# Patient Record
Sex: Female | Born: 1990 | Race: Black or African American | Hispanic: No | Marital: Single | Smoking: Never smoker
Health system: Southern US, Community
[De-identification: ages and names within clinical notes are randomized; demographics above are authoritative.]

---

## 2013-07-31 ENCOUNTER — Emergency Department (HOSPITAL_COMMUNITY): Payer: Self-pay

## 2013-07-31 ENCOUNTER — Encounter (HOSPITAL_COMMUNITY): Payer: Self-pay | Admitting: Emergency Medicine

## 2013-07-31 ENCOUNTER — Emergency Department (HOSPITAL_COMMUNITY)
Admission: EM | Admit: 2013-07-31 | Discharge: 2013-07-31 | Disposition: A | Discharge status: Home / Self Care | Payer: Self-pay | Attending: Emergency Medicine | Admitting: Emergency Medicine

## 2013-07-31 DIAGNOSIS — R112 Nausea with vomiting, unspecified: Secondary | ICD-10-CM | POA: Insufficient documentation

## 2013-07-31 DIAGNOSIS — R51 Headache: Secondary | ICD-10-CM | POA: Insufficient documentation

## 2013-07-31 DIAGNOSIS — R109 Unspecified abdominal pain: Secondary | ICD-10-CM | POA: Insufficient documentation

## 2013-07-31 DIAGNOSIS — R05 Cough: Secondary | ICD-10-CM

## 2013-07-31 DIAGNOSIS — R059 Cough, unspecified: Secondary | ICD-10-CM

## 2013-07-31 DIAGNOSIS — Z8709 Personal history of other diseases of the respiratory system: Secondary | ICD-10-CM | POA: Insufficient documentation

## 2013-07-31 DIAGNOSIS — Z3202 Encounter for pregnancy test, result negative: Secondary | ICD-10-CM | POA: Insufficient documentation

## 2013-07-31 DIAGNOSIS — R0602 Shortness of breath: Secondary | ICD-10-CM | POA: Insufficient documentation

## 2013-07-31 LAB — POC URINE PREG, ED: Preg Test, Ur: NEGATIVE

## 2013-07-31 MED ORDER — ALBUTEROL SULFATE HFA 108 (90 BASE) MCG/ACT IN AERS
2.0000 | INHALATION_SPRAY | RESPIRATORY_TRACT | Status: DC
  Filled 2013-07-31: qty 6.7

## 2013-07-31 MED ORDER — PREDNISONE 20 MG PO TABS
40.0000 mg | ORAL_TABLET | Freq: Once | ORAL | Status: AC
  Administered 2013-07-31: 40 mg via ORAL
  Filled 2013-07-31: qty 2

## 2013-07-31 MED ORDER — PREDNISONE 20 MG PO TABS
ORAL_TABLET | ORAL | Status: AC
Start: 2013-07-31 — End: ?

## 2013-07-31 MED ORDER — IPRATROPIUM-ALBUTEROL 0.5-2.5 (3) MG/3ML IN SOLN
3.0000 mL | Freq: Once | RESPIRATORY_TRACT | Status: AC
  Administered 2013-07-31: 3 mL via RESPIRATORY_TRACT
  Filled 2013-07-31: qty 3

## 2013-07-31 NOTE — ED Notes (Signed)
Pt discharged.Vital signs stable and GCS 15 

## 2013-07-31 NOTE — ED Provider Notes (Signed)
CSN: [id number]     Arrival date & time 07/31/13  2016 History   First MD Initiated Contact with Patient 07/31/13 2128     Chief Complaint  Patient presents with  . Cough  . Headache     (Consider location/radiation/quality/duration/timing/severity/associated sxs/prior Treatment) HPI 23 yo female presents to ED with cough x 2 weeks. Patient states 2-3 weeks ago she got the GI bug and afterwards developed a "cold" with Sore throat, HAs, Cough, congestion. Patient states all other symptom improved but cough has been persistent. Patient describes dry cough that is better during the day and worse in the evenings. Patient denies fever/chills, HA, and Chest pain. Admits to SOB associated with cough, nausea, and a couple episodes of posttussive vomiting. Patient admits to a generalized belly pain currently but thinks it might be because she hasnt had anything to eat today.   Age > 21 yo: No HR > 100 bpm: No O2 sat on RA < 95%: No Prior hx of venous thromboembolism:No Trauma or surgery in past 4 wks:No Hemoptysis:No Exogenous Estrogen use:No Unilateral Leg swelling: No Pre tests probability for PE < 15%:No      History reviewed. No pertinent past medical history. History reviewed. No pertinent past surgical history. No family history on file. History  Substance Use Topics  . Smoking status: Never Smoker   . Smokeless tobacco: Not on file  . Alcohol Use: Yes   OB History   Grav Para Term Preterm Abortions TAB SAB Ect Mult Living                 Review of Systems  All other systems reviewed and are negative.      Allergies  Review of patient's allergies indicates no known allergies.  Home Medications   Current Outpatient Rx  Name  Route  Sig  Dispense  Refill  . levonorgestrel (MIRENA) 20 MCG/24HR IUD   Intrauterine   1 each by Intrauterine route once.         Marland Kitchen. PE-DM-APAP & Doxylamin-DM-APAP (VICKS DAYQUIL/NYQUIL CLD & FLU) (LIQUID) MISC   Oral   Take 2 capsules  by mouth 2 (two) times daily as needed (for cold).         . Phenylephrine-Pheniramine-DM (THERAFLU COLD & COUGH) 03-16-19 MG PACK   Oral   Take 1 Package by mouth daily as needed (for cold).         . Throat Lozenges (COUGH DROPS MT)   Mouth/Throat   Use as directed 1 lozenge in the mouth or throat every 4 (four) hours as needed (for sore throat).         . predniSONE (DELTASONE) 20 MG tablet      2 tabs po daily x 4 days   8 tablet   0    BP 102/60  Pulse 98  Temp(Src) 99.5 F (37.5 C) (Oral)  Resp 18  Ht 5\' 4"  (1.626 m)  Wt 124 lb 8 oz (56.473 kg)  BMI 21.36 kg/m2  SpO2 99%  LMP 06/28/2013 Physical Exam  Nursing note and vitals reviewed. Constitutional: She is oriented to person, place, and time. She appears well-developed and well-nourished. No distress.  HENT:  Head: Normocephalic and atraumatic.  Right Ear: Tympanic membrane and ear canal normal.  Left Ear: Tympanic membrane and ear canal normal.  Nose: Nose normal. Right sinus exhibits no maxillary sinus tenderness and no frontal sinus tenderness. Left sinus exhibits no maxillary sinus tenderness and no frontal sinus tenderness.  Mouth/Throat: Uvula  is midline, oropharynx is clear and moist and mucous membranes are normal. No oropharyngeal exudate, posterior oropharyngeal edema or posterior oropharyngeal erythema.  Eyes: Conjunctivae are normal. Pupils are equal, round, and reactive to light. Right eye exhibits no discharge. Left eye exhibits no discharge. No scleral icterus.  Neck: Normal range of motion and phonation normal. Neck supple. No JVD present. No rigidity. No tracheal deviation, no edema and no erythema present.  Cardiovascular: Normal rate and regular rhythm.  Exam reveals no gallop and no friction rub.   No murmur heard. Pulmonary/Chest: Effort normal. No stridor. No respiratory distress. She has decreased breath sounds in the right lower field. She has no wheezes. She has no rhonchi. She has no  rales.  Abdominal: Soft. Bowel sounds are normal. There is no tenderness.  Musculoskeletal: Normal range of motion. She exhibits no edema.  Lymphadenopathy:    She has no cervical adenopathy.  Neurological: She is alert and oriented to person, place, and time.  Skin: Skin is warm and dry. She is not diaphoretic.  Psychiatric: She has a normal mood and affect. Her behavior is normal.    ED Course  Procedures (including critical care time) Labs Review Labs Reviewed  POC URINE PREG, ED   Imaging Review Dg Chest 2 View (if Patient Has Fever And/or Copd)  07/31/2013   CLINICAL DATA:  Cough and congestion from previous 3 weeks  EXAM: CHEST  2 VIEW  COMPARISON:  None.  FINDINGS: The lungs are adequately inflated. There is no focal infiltrate. The lung markings are coarse in the retrocardiac region likely on the right. The cardiopericardial silhouette is normal in size. The pulmonary vascularity is not engorged. There is no pleural effusion or pneumothorax. The mediastinum is normal in width. The observed portions of the bony thorax appear normal. Nipple rings are present bilaterally.  IMPRESSION: There is no alveolar pneumonia. Subsegmental atelectasis in the right lower lobe may be present and may be associated with acute bronchitis. There is no evidence of CHF.   Electronically Signed   By: David  [place]   On: 07/31/2013 20:32     EKG Interpretation None      MDM   Final diagnoses:  Cough  Patient afebrile with normal vital signs.  Patient PERC negative.  Urine preg negative CXR shows subsegmental atelectasis in RT lower lobe with possible bronchitis. No pneumonia or CHF.   Patient given breathing tx in ED. Patient reports breathing improved. Plan to start patient on Prednisone burst for suspected bronchitis. Patient given MDI albuterol. Discussed findings with patient. Advised follow [facility] with [facility] Wellness. Recommend return to ED should symptoms worsen. Patient  agrees with plan. Discharged in good condition.   Meds given in ED:  Medications  albuterol (PROVENTIL HFA;VENTOLIN HFA) 108 (90 BASE) MCG/ACT inhaler 2 puff (not administered)  ipratropium-albuterol (DUONEB) 0.5-2.5 (3) MG/3ML nebulizer solution 3 mL (3 mLs Nebulization Given 07/31/13 2152)  predniSONE (DELTASONE) tablet 40 mg (40 mg Oral Given 07/31/13 2219)    Discharge Medication List as of 07/31/2013 10:10 PM    START taking these medications   Details  predniSONE (DELTASONE) 20 MG tablet 2 tabs po daily x 4 days, Print          Rudene Anda, PA-C 08/01/13 [phone]

## 2013-07-31 NOTE — Discharge Instructions (Signed)
Follow [facility] with [facility]. Take medications as directed. Return to Emergency department if you develop any chest pain, shortness of breath, fever/chills, or coughing up blood. Resource guide provided below for further follow up.   Emergency Department Resource Guide 1) Find a Doctor and Pay Out of Pocket Although you won't have to find out who is covered by your insurance plan, it is a good idea to ask around and get recommendations. You will then need to call the office and see if the doctor you have chosen will accept you as a new patient and what types of options they offer for patients who are self-pay. Some doctors offer discounts or will set up payment plans for their patients who do not have insurance, but you will need to ask so you aren't surprised when you get to your appointment.  2) Contact Your Local Health Department Not all health departments have doctors that can see patients for sick visits, but many do, so it is worth a call to see if yours does. If you don't know where your local health department is, you can check in your phone book. The CDC also has a tool to help you locate your state's health department, and many state websites also have listings of all of their local health departments.  3) Find a Walk-in Clinic If your illness is not likely to be very severe or complicated, you may want to try a walk in clinic. These are popping up all over the country in pharmacies, drugstores, and shopping centers. They're usually staffed by nurse practitioners or physician assistants that have been trained to treat common illnesses and complaints. They're usually fairly quick and inexpensive. However, if you have serious medical issues or chronic medical problems, these are probably not your best option.  No Primary Care Doctor: - Call Health Connect at  [phone] - they can help you locate a primary care doctor that  accepts your insurance, provides certain services,  etc. - Physician Referral Service- [phone]  Chronic Pain Problems: Organization         Address  Phone   Notes  [facility]  [phone] Patients need to be referred by their primary care doctor.   Medication Assistance: Organization         Address  Phone   Notes  [facility] [address] [place], [place] [address] [phone] --Must be a resident of [facility] -- Must have NO insurance coverage whatsoever (no Medicaid/ Medicare, etc.) -- The pt. MUST have a primary care doctor that directs their care regularly and follows them in the community   MedAssist  [phone]   [employer]  [phone]    Agencies that provide inexpensive medical care: Organization         Address  Phone   Notes  [facility]  [phone]   [facility]    [phone]   [facility] [address] [place], [place] [address] [phone]   [facility] [address], [place] [phone]   [facility]    [phone](336) [phone]   [facility]    [phone]   [facility]  [address], [place] Phone:  [phone]) [phone], Fax:  [phone] Hours of Operation:  9 am - 6 pm, M-F.  Also accepts Medicaid/Medicare and self-pay.  [facility] [address], [address], [place] Phone: [phone], Fax: [phone]. Hours of Operation:  8:30 am - 5:30 pm, M-F.  Also accepts Medicaid and self-pay.  [facility] [address] [place] Phone: [phone]   [place], [place], [place] [phone], Ext. [phone] Mondays & Thursdays: 7-9 AM.  First 15 patients are seen on a first come, first serve basis.    [place] Providers:  Organization         Address  Phone   Notes  [facility] [address], [place] [phone] Also accepts self-pay patients.  [facility] [address], [place]  [phone]   [place], [address], [place] [phone]   [facility] [address], [place] [phone]   Lucianne Lei [address], [place]   [phone] Only accepts [place] Access [place] patients after they have their name applied to their card.   Self-Pay (no insurance) in [facility]:  Organization         Address  Phone   Notes  Sickle Cell Patients, [facility] [place] [phone]   [facility] [place] [phone]   [facility]  [place], [place], [place] [phone]   [facility]/Dr. Osei-Bonsu  [address] [place] or [address], [place] [phone] Phone number for both [place] and [place] locations is the same.  Urgent Medical and [facility] [address], [place] [phone]   [facility] [address], [place] or [address] [phone]   [facility] [address] [place] [phone], phone; [phone], fax Sees patients 1st and 3rd Saturday of every month.  Must not qualify for public or private insurance (i.e. Medicaid, Medicare, [place] Health Choice, Veterans' Benefits)  Household income should be no more than 200% of the poverty level The clinic cannot treat you if you are pregnant or think you are pregnant  Sexually transmitted diseases are not treated at the clinic.    Dental Care: Organization         Address  Phone  Notes  [facility] Department of [facility] [place] [phone] Accepts children up to  age 78 who are enrolled in [place] or [place]; pregnant women with a Medicaid card; and children who have applied for Medicaid or [place] Health Choice, but were declined, whose parents can pay a reduced fee at time of service.  [facility] Department of [facility]  [address], [place] [phone] Accepts children up to age 39 who are enrolled in [place] or [place]; pregnant women with a Medicaid card; and children who have applied for Medicaid or [place] Health Choice, but were declined, whose parents can pay a reduced fee at time of service.  [facility] Adult Dental Access PROGRAM  [place] [phone] Patients are seen by appointment only. Walk-ins are not accepted. [place] will see patients 78 years of age and older. Monday - Tuesday (8am-5pm) Most Wednesdays (8:30-5pm) $30 per  visit, cash only  [facility] Adult [employer] PROGRAM  [address], [facility] [phone] Patients are seen by appointment only. Walk-ins are not accepted. [place] will see patients 25 years of age and older. One Wednesday Evening (Monthly: Volunteer Based).  $30 per visit, cash only  [place]  [phone] for adults; Children under age 88, call Graduate Pediatric Dentistry at [phone]. Children aged 53-14, please call [phone] to request a pediatric application.  Dental services are provided in all areas of dental care including fillings, crowns and bridges, complete and partial dentures, implants, gum treatment, root canals, and extractions. Preventive care is also provided. Treatment is provided to both adults and children. Patients are selected via a lottery and there is often a waiting list.   [facility] [address] [place]  [phone] [email or web address]   [facility] [address] [place], [place] [phone], Ext. [phone] Second and Fourth Thursday of  each month, opens at 6:30 AM; Clinic ends at 9 AM.  Patients are seen on a first-come first-served basis, and a limited number are seen during each clinic.   [facility]  [address] Hillard Danker [place], [place] [phone]   Eligibility Requirements You must have lived in [place], [place], or [place] counties for at least the last three months.   You cannot be eligible for state or federal sponsored [employer], including [employer], [place], or [employer].   You generally cannot be eligible for healthcare insurance through your employer.    How to apply: Eligibility screenings are held every Tuesday and Wednesday afternoon from 1:00 pm until 4:00 pm. You do not need an appointment for the interview!  [facility] [address], [place], [place]   [place]  [place] Department  [place]  [phone]    Behavioral Health Resources in the Community: Intensive Outpatient Programs Organization         Address  Phone  Notes  [place] [address] [place], [place] [phone]   [facility] Outpatient [address] [place], [place]   ADS: Alcohol & Drug Svcs [address] [place], [place]   [place] [address],  [place], [place] or [phone]   Substance Abuse Resources Organization         Address  Phone  Notes  Alcohol and Drug Services  [phone]   [place]  [phone]   [facility] [place]   Chinita Pester  [phone]   Residential & Outpatient Substance Abuse Program  [phone]   Psychological Services Organization         Address  Phone  Notes  [facility] [place]  [place]  [phone]   [place] [address],  [place] or [phone]    Mobile Crisis Teams Organization         Address  Phone  Notes  [facility], Mobile Crisis Care Unit  [phone]   [facility]  [address] [place], [place]   [place] [address], [place] [phone]    Self-Help/Support Groups Organization         Address  Phone  Notes  [facility]oc[facility] of [place] - variety of support groups  336- I7437963 Call for more information  Narcotics Anonymous (NA), [facility] [address], [employer] [place]  2 meetings at this location   Statistician         Address  Phone  Notes  [facility] [address],    [place]  [phone]   [facility]  [address], [place] [address], [place], [place] [phone]   [facility] [address] [place], [place] [phone] Admissions: 8am-3pm M-F  [facility] [address],    [place], [place] [phone]   [facility] [address] [place], [place], [place] [phone]   [facility] [address]  [place], [place] [phone]   [facility] - Intensive Outpatient [address], [place], [place] [phone]   [facility] (Addiction Recovery Care [facility]oc[facility] [address] [place]  [place], [place] [phone] or [phone]   [facility] (RTS) [address], [place], [place] [phone] Accepts Medicaid  [facility] [place] [address],  [place] [phone] Substance Abuse/Addiction Treatment   [facility] Organization         Address  Phone  Notes  [facility]  [phone]   Angie Fava, PhD [address] Ervin Knack [place], [place]   [phone] or [phone]   [facility]   [address] [place], [place] [phone]     [facility] [address], [place], [place] [phone] Insurance/Medicaid/sponsorship through [facility] and Families [address]                                    [place], [place] [phone] Therapy/tele-psych/case  [facility] [address], [place] [phone]    Dr. Lolly Mustache  [phone]   [facility]  [employer] [facility] Dept. 1) [address] [place] 2) [address] [place] 3)  [address], [place] [phone]  [phone]   [facility] Child Abuse Hotline 847-068-1101 or [phone] (After Hours)

## 2013-07-31 NOTE — ED Notes (Addendum)
Pt c/o cough, ha, all over aches for 2 wks.  sts nausea and vomiting today.  otc meds not helping

## 2013-08-01 NOTE — ED Provider Notes (Signed)
Medical screening examination/treatment/procedure(s) were performed by non-physician practitioner and as supervising physician I was immediately available for consultation/collaboration.   EKG Interpretation None       Toy BakerAnthony T Yanique Mulvihill, MD 08/01/13 1556

## 2015-01-23 IMAGING — CR DG CHEST 2V
2 series · 2 of 2 positions shown · non-contrast
Comparison: None.

CLINICAL DATA: Cough and congestion from previous 3 weeks

EXAM:
CHEST  2 VIEW

[w chest pa]
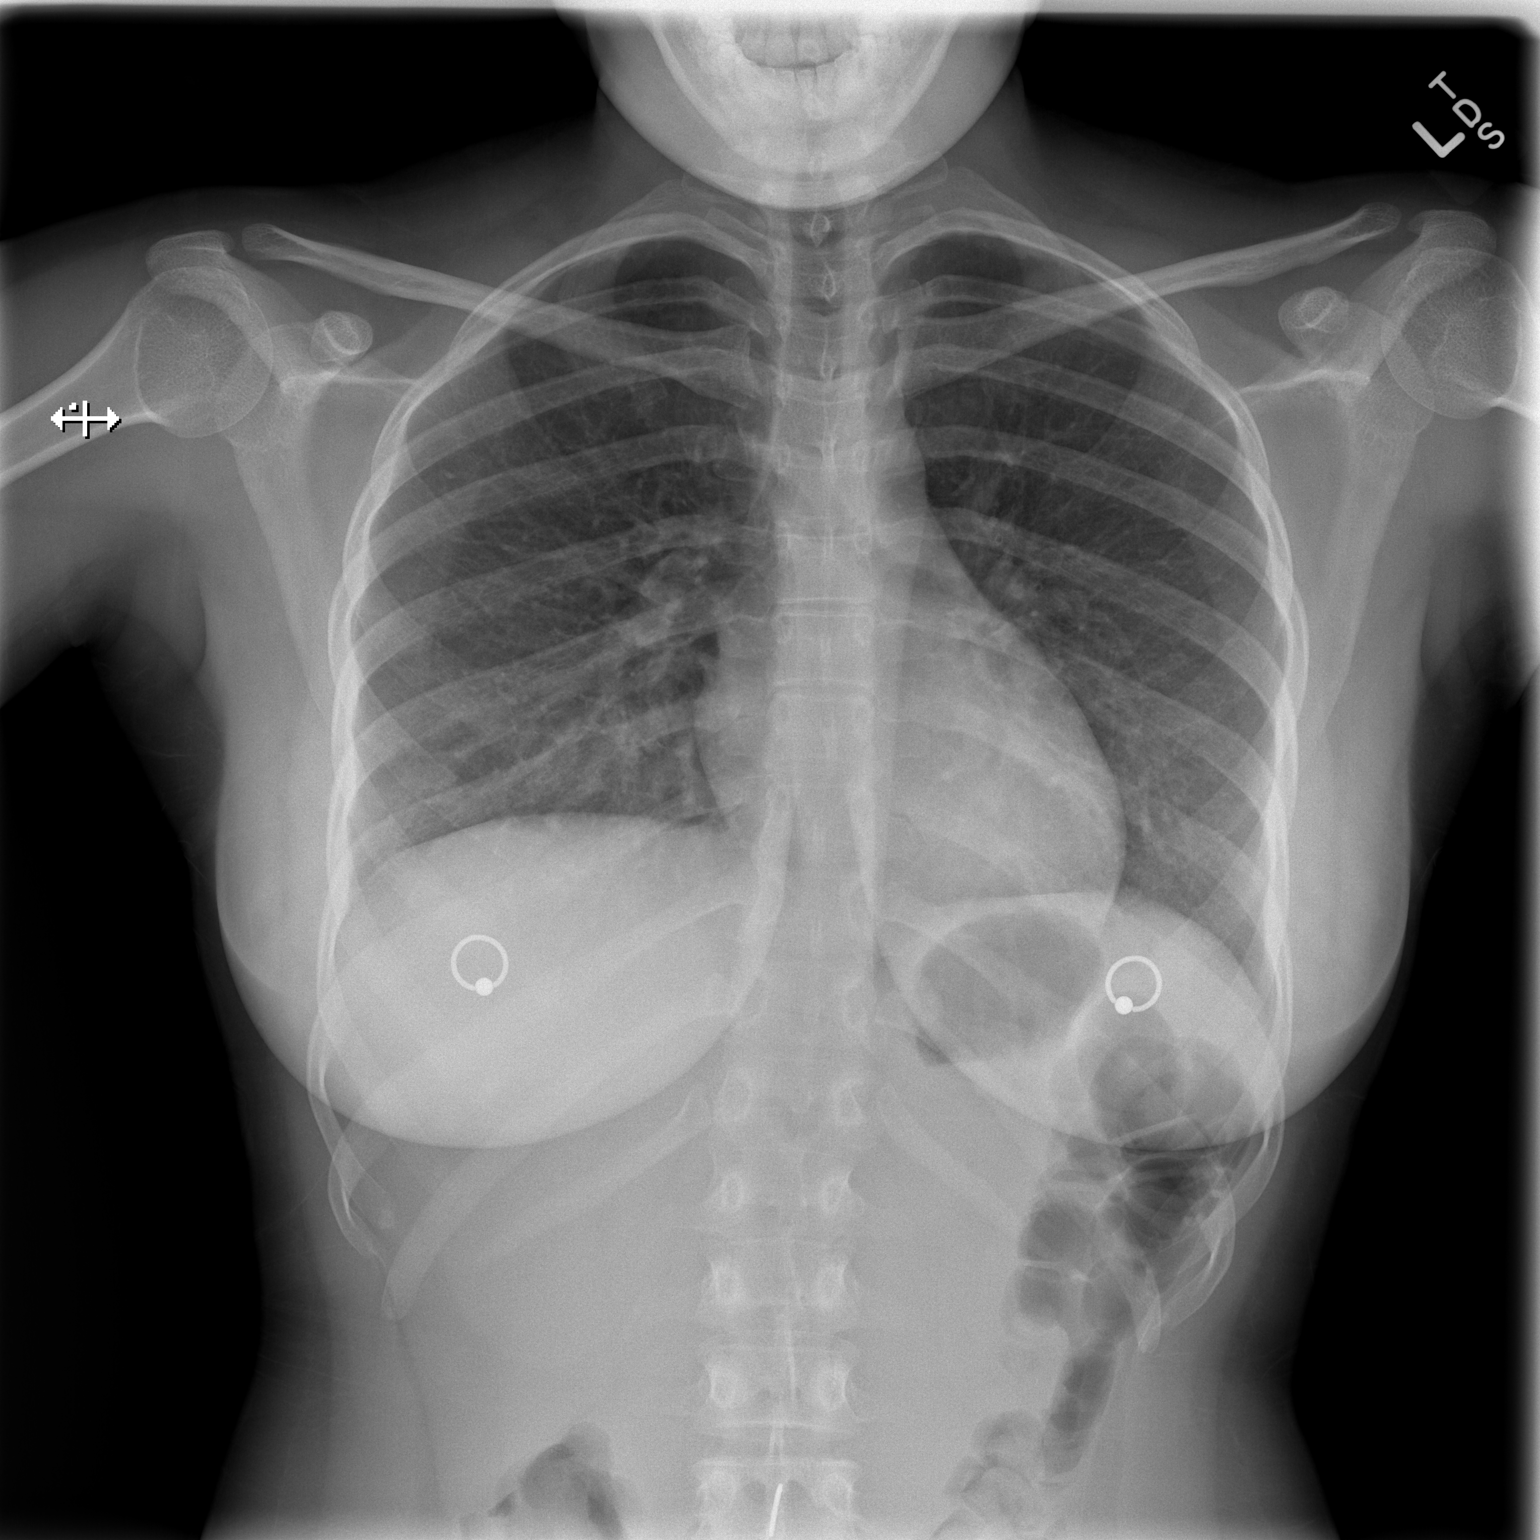

[w chest lat]
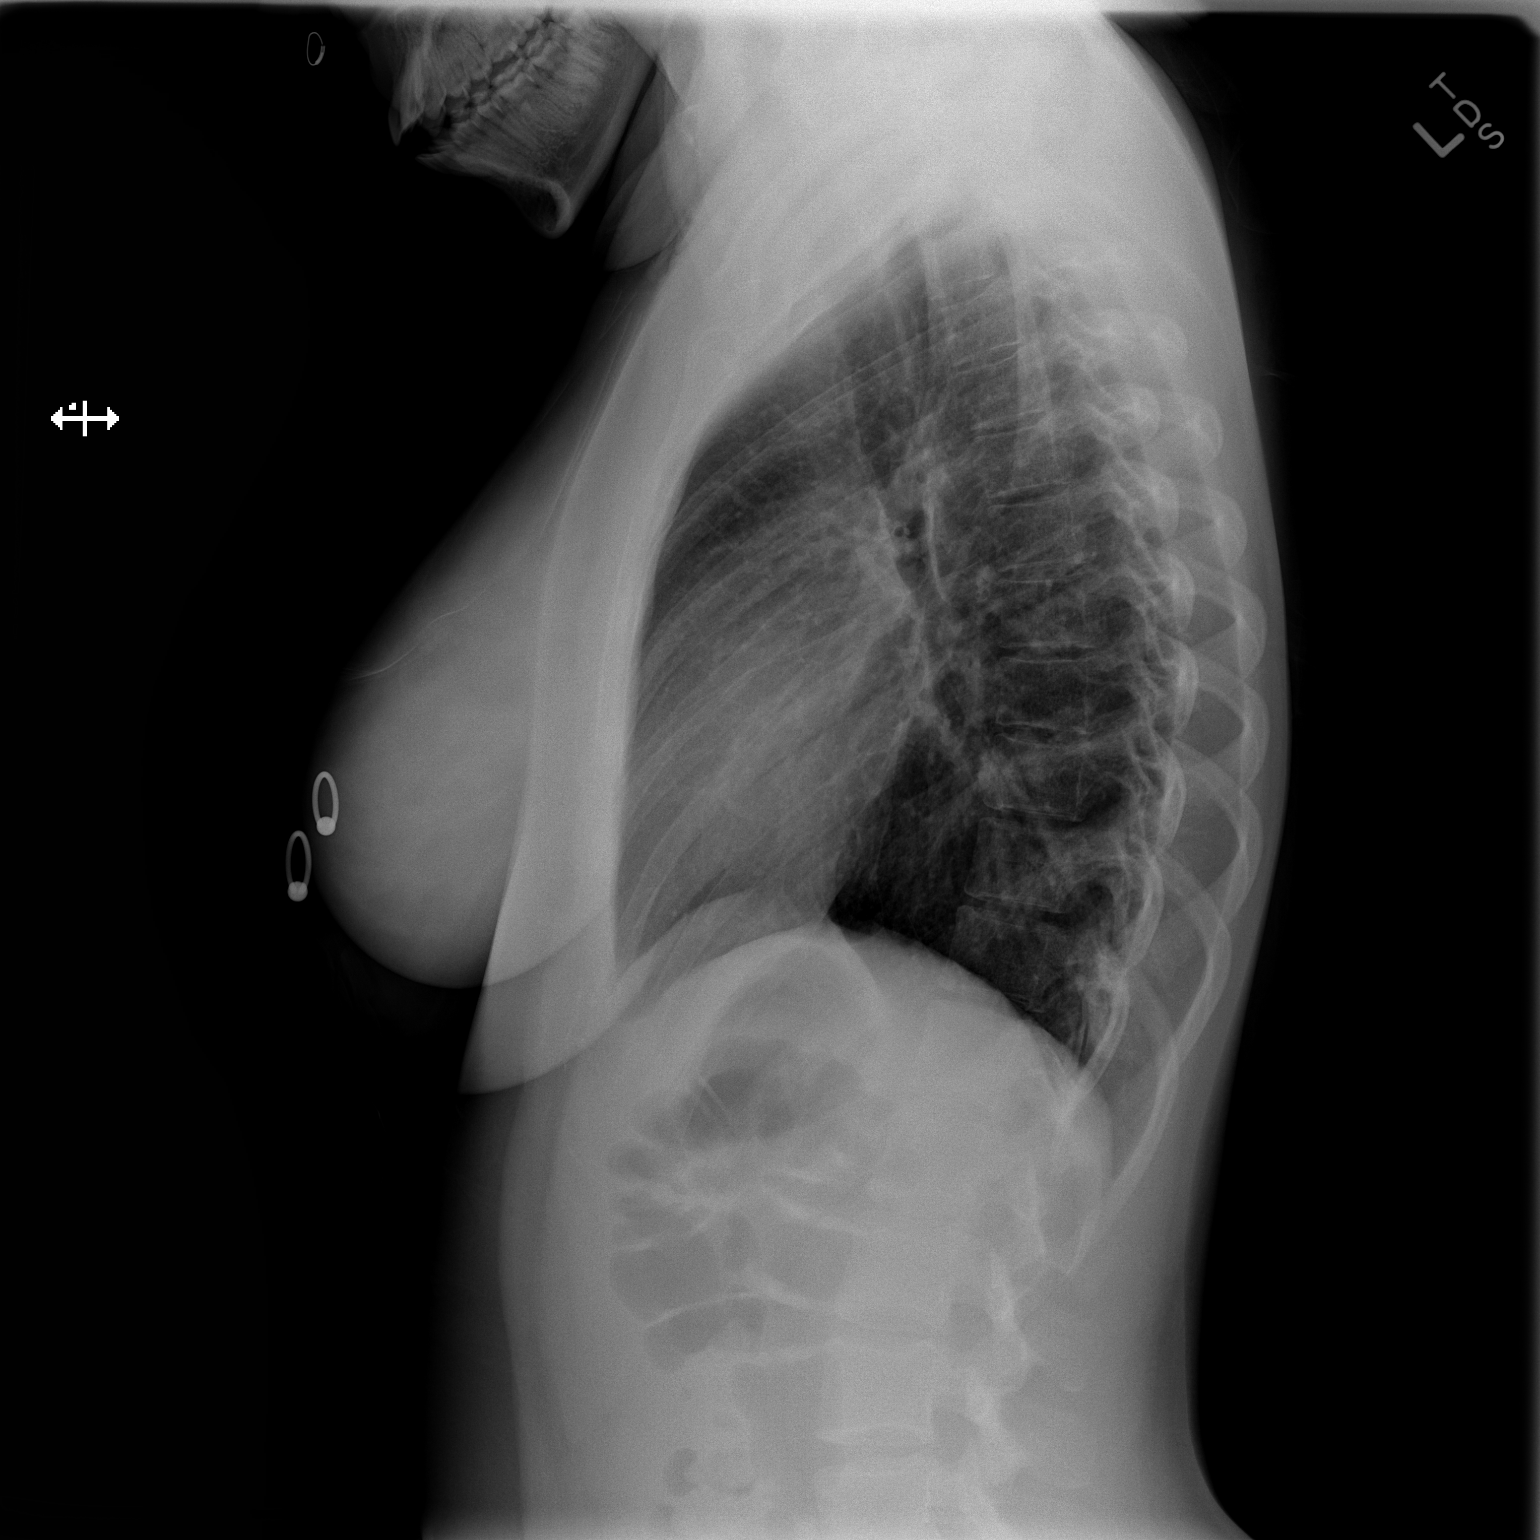

[2 of 2 positions shown; findings below may reference images not displayed]

FINDINGS: The lungs are adequately inflated. There is no focal infiltrate. The
lung markings are coarse in the retrocardiac region likely on the
right. The cardiopericardial silhouette is normal in size. The
pulmonary vascularity is not engorged. There is no pleural effusion
or pneumothorax. The mediastinum is normal in width. The observed
portions of the bony thorax appear normal. Nipple rings are present
bilaterally.
IMPRESSION: There is no alveolar pneumonia. Subsegmental atelectasis in the
right lower lobe may be present and may be associated with acute
bronchitis. There is no evidence of CHF.
